# Patient Record
Sex: Female | Born: 2000 | Race: White | Hispanic: No | Marital: Single | Smoking: Never smoker
Health system: Southern US, Community
[De-identification: ages and names within clinical notes are randomized; demographics above are authoritative.]

---
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
## 2022-04-22 ENCOUNTER — Other Ambulatory Visit: Payer: Self-pay

## 2022-04-22 ENCOUNTER — Encounter (HOSPITAL_BASED_OUTPATIENT_CLINIC_OR_DEPARTMENT_OTHER): Payer: Self-pay

## 2022-04-22 DIAGNOSIS — M791 Myalgia, unspecified site: Secondary | ICD-10-CM | POA: Insufficient documentation

## 2022-04-22 DIAGNOSIS — Z5321 Procedure and treatment not carried out due to patient leaving prior to being seen by health care provider: Secondary | ICD-10-CM | POA: Diagnosis not present

## 2022-04-22 DIAGNOSIS — R079 Chest pain, unspecified: Secondary | ICD-10-CM | POA: Diagnosis present

## 2022-04-22 NOTE — ED Triage Notes (Signed)
Pt to ED by POV from home with c/o CP and body aches. Pt has been seen by [facility] ED and PCP for the same. Onset of 2 weeks ago. Arrives A+O, VSS, NADN.

## 2022-04-23 ENCOUNTER — Encounter (HOSPITAL_BASED_OUTPATIENT_CLINIC_OR_DEPARTMENT_OTHER): Payer: Self-pay | Admitting: Emergency Medicine

## 2022-04-23 ENCOUNTER — Emergency Department (HOSPITAL_BASED_OUTPATIENT_CLINIC_OR_DEPARTMENT_OTHER)
Admission: EM | Admit: 2022-04-23 | Discharge: 2022-04-23 | Discharge status: Against Medical Advice | Payer: 59 | Attending: Emergency Medicine | Admitting: Emergency Medicine

## 2022-04-23 ENCOUNTER — Emergency Department (HOSPITAL_BASED_OUTPATIENT_CLINIC_OR_DEPARTMENT_OTHER)
Admission: EM | Admit: 2022-04-23 | Discharge: 2022-04-23 | Discharge status: Against Medical Advice | Payer: 59 | Source: Home / Self Care

## 2022-04-23 DIAGNOSIS — R079 Chest pain, unspecified: Secondary | ICD-10-CM | POA: Insufficient documentation

## 2022-04-23 DIAGNOSIS — Z5321 Procedure and treatment not carried out due to patient leaving prior to being seen by health care provider: Secondary | ICD-10-CM | POA: Insufficient documentation

## 2022-04-23 NOTE — ED Notes (Signed)
Pt was seen leaving by registration 

## 2022-04-23 NOTE — ED Triage Notes (Signed)
"  Heart pains" starting 2 weeks ago, several ed visit and pcp visit not improved, "getting worse" Intermittant, "it just comes and goes"

## 2022-04-23 NOTE — ED Notes (Signed)
Patient told registration that she was leaving and was seen leaving by registration.

## 2022-11-13 DIAGNOSIS — Z111 Encounter for screening for respiratory tuberculosis: Secondary | ICD-10-CM | POA: Insufficient documentation

## 2023-09-01 ENCOUNTER — Encounter (HOSPITAL_BASED_OUTPATIENT_CLINIC_OR_DEPARTMENT_OTHER): Payer: Self-pay | Admitting: Family Medicine

## 2023-09-01 ENCOUNTER — Ambulatory Visit (INDEPENDENT_AMBULATORY_CARE_PROVIDER_SITE_OTHER): Payer: 59 | Admitting: Family Medicine

## 2023-09-01 VITALS — BP 112/78 | HR 93 | Ht 65.0 in | Wt 133.6 lb

## 2023-09-01 DIAGNOSIS — Z0000 Encounter for general adult medical examination without abnormal findings: Secondary | ICD-10-CM

## 2023-09-01 DIAGNOSIS — F419 Anxiety disorder, unspecified: Secondary | ICD-10-CM | POA: Diagnosis not present

## 2023-09-01 MED ORDER — SERTRALINE HCL 50 MG PO TABS: 50.0000 mg | ORAL_TABLET | Freq: Every day | ORAL | 1 refills | Status: DC

## 2023-09-01 NOTE — Assessment & Plan Note (Signed)
Discussed management options.  She would like to start alternative medication.  Discussed recommendation for SSRI, could consider medication exertionally.  She does report that her mother does take sertraline with good control of symptoms.  Can proceed with low-dose of this medication and monitor progress.  We will plan for follow-up in about 2 weeks to assess response and tolerability of medication.  Follow-up visit can be virtual We also discussed role of counseling/therapy.  She has not done this in the past and declines referral for this at present.

## 2023-09-01 NOTE — Progress Notes (Signed)
New Patient Office Visit  Subjective    Patient ID: Savannah Flowers, female    DOB: 2001-02-02  Age: 22 y.o. MRN: [id number]  CC:  Chief Complaint  Patient presents with   New Patient (Initial Visit)    New patient was seeing marsha white at [facility] and needed a new primary care would like to get back on anxiety medicine but does not want lexapro    HPI Savannah Flowers presents to establish care Last PCP - Kathlen Brunswick with [facility]  Anxiety: Reports being diagnosed however more than 1 year ago.  She initially started on Lexapro and generally was doing well with this with good symptom control reported.  Unfortunately, she does feel that she had some issues/side effects with the medication.  She did have some trouble with left-sided chest wall pain.  She did have evaluation at urgent care and reportedly was told that it was related to fibrocystic changes of breast.  She does note that when she stopped taking Lexapro that this chest pain resolved.  She is wanting to resume medication to help with controlling anxiety symptoms, however would prefer to not go back onto Lexapro. She denies any issues with lumps/bumps or other issues with breasts.  Denies any wounds or skin changes or other concerns.  She reports her last physical with her prior PCP was about 1 year ago.  Would like to arrange to have next physical completed  Patient is originally from [place]. Patient works at [employer]. 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 Eventual plan is for nursing school. Outside of work, she enjoys spending time with friends.  Outpatient Encounter Medications as of 09/01/2023  Medication Sig   sertraline (ZOLOFT) 50 MG tablet Take 1 tablet (50 mg total) by mouth daily.   [DISCONTINUED] doxycycline (ADOXA) 100 MG tablet Take 100 mg by mouth 2 (two) times daily.   [DISCONTINUED] ferrous sulfate 325 (65 FE) MG tablet Take 325 mg by mouth daily with breakfast.   [DISCONTINUED] predniSONE  (STERAPRED UNI-PAK 21 TAB) 5 MG (21) TBPK tablet Take 5 mg by mouth daily.   No facility-administered encounter medications on file as of 09/01/2023.    History reviewed. No pertinent past medical history.  Past Surgical History:  Procedure Laterality Date   PERCUTANEOUS PINNING  06/13/2012   Procedure: PERCUTANEOUS PINNING EXTREMITY;  Surgeon: Budd Palmer, MD;  Location: [facility] OR;  Service: Orthopedics;  Laterality: Left;  Closed Reduction and percutaneous pinning supracondylar humerus fracture/elbow    History reviewed. No pertinent family history.  Social History   Socioeconomic History   Marital status: Single    Spouse name: Not on file   Number of children: Not on file   Years of education: Not on file   Highest education level: 12th grade  Occupational History   Not on file  Tobacco Use   Smoking status: Never    Passive exposure: Never   Smokeless tobacco: Not on file  Vaping Use   Vaping status: Every Day  Substance and Sexual Activity   Alcohol use: No   Drug use: No   Sexual activity: Not on file  Other Topics Concern   Not on file  Social History Narrative   Not on file   Social Determinants of Health   Financial Resource Strain: Medium Risk (08/26/2023)   Overall Financial Resource Strain (CARDIA)    Difficulty of Paying Living Expenses: Somewhat hard  Food Insecurity: Patient Declined (08/26/2023)   Hunger Vital Sign  Worried About Programme researcher, broadcasting/film/video in the Last Year: Patient declined    Barista in the Last Year: Patient declined  Transportation Needs: No Transportation Needs (08/26/2023)   PRAPARE - Administrator, Civil Service (Medical): No    Lack of Transportation (Non-Medical): No  Physical Activity: Insufficiently Active (08/26/2023)   Exercise Vital Sign    Days of Exercise per Week: 1 day    Minutes of Exercise per Session: 60 min  Stress: Stress Concern Present (08/26/2023)   [employer] of Occupational Health -  Occupational Stress Questionnaire    Feeling of Stress : Very much  Social Connections: Moderately Isolated (08/26/2023)   Social Connection and Isolation Panel [NHANES]    Frequency of Communication with Friends and Family: More than three times a week    Frequency of Social Gatherings with Friends and Family: Three times a week    Attends Religious Services: 1 to 4 times per year    Active Member of Clubs or Organizations: No    Attends Banker Meetings: Not on file    Marital Status: Never married  Intimate Partner Violence: Unknown (01/21/2022)   Received from [employer], [facility]   HITS    Physically Hurt: Not on file    Insult or Talk Down To: Not on file    Threaten Physical Harm: Not on file    Scream or Curse: Not on file    Objective    BP 112/78 (BP Location: Right Arm, Patient Position: Sitting, Cuff Size: Normal)   Pulse 93   Ht 5\' 5"  (1.651 m)   Wt 133 lb 9.6 oz (60.6 kg)   SpO2 100%   BMI 22.23 kg/m   Physical Exam  22 year old female in no acute distress Cardiovascular exam with regular rate and rhythm, no murmur appreciated Lungs clear to auscultation bilaterally  Assessment & Plan:   Anxiety Assessment & Plan: Discussed management options.  She would like to start alternative medication.  Discussed recommendation for SSRI, could consider medication exertionally.  She does report that her mother does take sertraline with good control of symptoms.  Can proceed with low-dose of this medication and monitor progress.  We will plan for follow-up in about 2 weeks to assess response and tolerability of medication.  Follow-up visit can be virtual We also discussed role of counseling/therapy.  She has not done this in the past and declines referral for this at present.   Wellness examination -     CBC with Differential/Platelet; Future -     Comprehensive metabolic panel; Future -     Lipid panel; Future -     TSH Rfx on Abnormal to Free T4;  Future  Other orders -     Sertraline HCl; Take 1 tablet (50 mg total) by mouth daily.  Dispense: 30 tablet; Refill: 1  Return in about 6 weeks (around 10/13/2023) for CPE with fasting labs 1 week prior.    ___________________________________________ Snigdha Howser de [place], MD, ABFM, CAQSM Primary Care and Sports Medicine [facility]

## 2023-09-15 ENCOUNTER — Telehealth (HOSPITAL_BASED_OUTPATIENT_CLINIC_OR_DEPARTMENT_OTHER): Payer: Self-pay | Admitting: *Deleted

## 2023-09-15 ENCOUNTER — Encounter (HOSPITAL_BASED_OUTPATIENT_CLINIC_OR_DEPARTMENT_OTHER): Payer: Self-pay | Admitting: Family Medicine

## 2023-09-15 ENCOUNTER — Telehealth (HOSPITAL_BASED_OUTPATIENT_CLINIC_OR_DEPARTMENT_OTHER): Payer: 59 | Admitting: Family Medicine

## 2023-09-15 DIAGNOSIS — F419 Anxiety disorder, unspecified: Secondary | ICD-10-CM

## 2023-09-15 NOTE — Telephone Encounter (Signed)
Copied from CRM [id number]. Topic: Appointments - Appointment Info/Confirmation >> Sep 12, 2023  9:02 AM Conni Elliot wrote: Patient/patient representative is calling for information regarding an appointment. Pt missed confirmation call and called back. Pt also has questions in regards to 12/31 appt

## 2023-09-15 NOTE — Progress Notes (Signed)
Appointment canceled and rescheduled.

## 2023-09-16 ENCOUNTER — Telehealth (HOSPITAL_BASED_OUTPATIENT_CLINIC_OR_DEPARTMENT_OTHER): Payer: 59 | Admitting: Family Medicine

## 2023-09-16 ENCOUNTER — Encounter (HOSPITAL_BASED_OUTPATIENT_CLINIC_OR_DEPARTMENT_OTHER): Payer: Self-pay | Admitting: Family Medicine

## 2023-09-16 DIAGNOSIS — F419 Anxiety disorder, unspecified: Secondary | ICD-10-CM

## 2023-09-16 NOTE — Progress Notes (Signed)
   Virtual Visit  I connected with  Savannah Flowers  on 09/16/23 by telehealth and verified that I am speaking with the correct person using two identifiers. Visit completed via video.  I discussed the limitations, risks, security and privacy concerns of performing an evaluation and management service by telephone, including the higher likelihood of inaccurate diagnosis and treatment, and the availability of in person appointments.  We also discussed the likely need of an additional face to face encounter for complete and high quality delivery of care.  I also discussed with the patient that there may be a patient responsible charge related to this service. The patient expressed understanding and wishes to proceed.  Provider location is in medical facility. Patient location is at their home, different from provider location. People involved in care of the patient during this telehealth encounter were myself, my nurse/medical assistant, and my front office/scheduling team member.  Review of Systems: No fevers, chills, night sweats, weight loss, chest pain, or shortness of breath.   Objective Findings:    General: Speaking full sentences, no audible heavy breathing.  Sounds alert and appropriately interactive.    Independent interpretation of tests performed by another provider:   None.  Brief History, Exam, Impression, and Recommendations:    Anxiety Patient has been taking medication as prescribed.  She does note that she has had some nausea with medication, denies any vomiting.  She does feel that she has had some improvement in symptoms with use of medication.  Denies any other side effects. We discussed options today related to medication.  Considerations include stopping medication and switching to alternative, decreasing dose of medication and monitoring response, continuing with current dose and assessing progress moving forward.  After discussion of options, patient elected to  proceed with same dose of medication at this time and monitoring side effects as well as symptom improvement.  We will plan to follow-up at previously scheduled appointment in about 1 month.  Did discuss with patient that we can schedule sooner appointment if needed based on progress with medication or any issues arise.  I discussed the above assessment and treatment plan with the patient. The patient was provided an opportunity to ask questions and all were answered. The patient agreed with the plan and demonstrated an understanding of the instructions.   The patient was advised to call back or seek an in-person evaluation if the symptoms worsen or if the condition fails to improve as anticipated.   I provided 11 minutes of face to face and non-face-to-face time during this encounter date, time was needed to gather information, review chart, records, communicate/coordinate with staff remotely, as well as complete documentation.   ___________________________________________ Izear Pine de [place], MD, ABFM, CAQSM Primary Care and Sports Medicine [facility]

## 2023-09-16 NOTE — Assessment & Plan Note (Signed)
Patient has been taking medication as prescribed.  She does note that she has had some nausea with medication, denies any vomiting.  She does feel that she has had some improvement in symptoms with use of medication.  Denies any other side effects. We discussed options today related to medication.  Considerations include stopping medication and switching to alternative, decreasing dose of medication and monitoring response, continuing with current dose and assessing progress moving forward.  After discussion of options, patient elected to proceed with same dose of medication at this time and monitoring side effects as well as symptom improvement.  We will plan to follow-up at previously scheduled appointment in about 1 month.  Did discuss with patient that we can schedule sooner appointment if needed based on progress with medication or any issues arise.

## 2023-09-26 ENCOUNTER — Other Ambulatory Visit (HOSPITAL_BASED_OUTPATIENT_CLINIC_OR_DEPARTMENT_OTHER): Payer: Self-pay | Admitting: Family Medicine

## 2023-10-02 ENCOUNTER — Encounter (HOSPITAL_BASED_OUTPATIENT_CLINIC_OR_DEPARTMENT_OTHER): Payer: 59 | Admitting: Family Medicine

## 2023-10-21 ENCOUNTER — Encounter (HOSPITAL_BASED_OUTPATIENT_CLINIC_OR_DEPARTMENT_OTHER): Payer: 59 | Admitting: Family Medicine

## 2023-10-30 ENCOUNTER — Encounter (HOSPITAL_BASED_OUTPATIENT_CLINIC_OR_DEPARTMENT_OTHER): Payer: Self-pay | Admitting: Family Medicine

## 2023-10-30 ENCOUNTER — Ambulatory Visit (INDEPENDENT_AMBULATORY_CARE_PROVIDER_SITE_OTHER): Payer: 59 | Admitting: Family Medicine

## 2023-10-30 VITALS — BP 118/78 | HR 76 | Ht 65.0 in | Wt 128.4 lb

## 2023-10-30 DIAGNOSIS — Z118 Encounter for screening for other infectious and parasitic diseases: Secondary | ICD-10-CM

## 2023-10-30 DIAGNOSIS — Z0000 Encounter for general adult medical examination without abnormal findings: Secondary | ICD-10-CM

## 2023-10-30 NOTE — Assessment & Plan Note (Signed)
 Routine HCM labs ordered. HCM reviewed/discussed. Anticipatory guidance regarding healthy weight, lifestyle and choices given. Recommend healthy diet.  Recommend approximately 150 minutes/week of moderate intensity exercise Recommend regular dental and vision exams Always use seatbelt/lap and shoulder restraints Recommend using smoke alarms and checking batteries at least twice a year Recommend using sunscreen when outside Discussed tetanus immunization recommendations, patient is UTD

## 2023-10-30 NOTE — Progress Notes (Signed)
 Subjective:    CC: Annual Physical Exam  HPI:  Savannah Flowers is a 23 y.o. presenting for annual physical  I reviewed the past medical history, family history, social history, surgical history, and allergies today and no changes were needed.  Please see the problem list section below in epic for further details.  Past Medical History: History reviewed. No pertinent past medical history. Past Surgical History: Past Surgical History:  Procedure Laterality Date   PERCUTANEOUS PINNING  06/13/2012   Procedure: PERCUTANEOUS PINNING EXTREMITY;  Surgeon: Ozell VEAR Bruch, MD;  Location: [facility] OR;  Service: Orthopedics;  Laterality: Left;  Closed Reduction and percutaneous pinning supracondylar humerus fracture/elbow   Social History: Social History   Socioeconomic History   Marital status: Single    Spouse name: Not on file   Number of children: Not on file   Years of education: Not on file   Highest education level: 12th grade  Occupational History   Not on file  Tobacco Use   Smoking status: Never    Passive exposure: Never   Smokeless tobacco: Never  Vaping Use   Vaping status: Every Day  Substance and Sexual Activity   Alcohol use: No   Drug use: No   Sexual activity: Not on file  Other Topics Concern   Not on file  Social History Narrative   Not on file   Social Drivers of Health   Financial Resource Strain: Medium Risk (08/26/2023)   Overall Financial Resource Strain (CARDIA)    Difficulty of Paying Living Expenses: Somewhat hard  Food Insecurity: Patient Declined (08/26/2023)   Hunger Vital Sign    Worried About Running Out of Food in the Last Year: Patient declined    Ran Out of Food in the Last Year: Patient declined  Transportation Needs: No Transportation Needs (08/26/2023)   PRAPARE - Administrator, Civil Service (Medical): No    Lack of Transportation (Non-Medical): No  Physical Activity: Insufficiently Active (08/26/2023)   Exercise Vital Sign     Days of Exercise per Week: 1 day    Minutes of Exercise per Session: 60 min  Stress: Stress Concern Present (08/26/2023)   [employer] of Occupational Health - Occupational Stress Questionnaire    Feeling of Stress : Very much  Social Connections: Moderately Isolated (08/26/2023)   Social Connection and Isolation Panel [NHANES]    Frequency of Communication with Friends and Family: More than three times a week    Frequency of Social Gatherings with Friends and Family: Three times a week    Attends Religious Services: 1 to 4 times per year    Active Member of Clubs or Organizations: No    Attends Engineer, Structural: Not on file    Marital Status: Never married   Family History: History reviewed. No pertinent family history. Allergies: Allergies  Allergen Reactions   Aspirin-Acetaminophen -Caffeine Swelling    Lips swollen   Excedrin Tension Headache [Acetaminophen -Caffeine] Swelling   Neomycin-Bacitracin Zn-Polymyx Other (See Comments)    pain   Medications: See med rec.  Review of Systems: No headache, visual changes, nausea, vomiting, diarrhea, constipation, dizziness, abdominal pain, skin rash, fevers, chills, night sweats, swollen lymph nodes, weight loss, chest pain, body aches, joint swelling, muscle aches, shortness of breath, mood changes, visual or auditory hallucinations.  Objective:    BP 118/78 (BP Location: Right Arm, Patient Position: Sitting, Cuff Size: Normal)   Pulse 76   Ht 5' 5 (1.651 m)   Wt 128 lb  6.4 oz (58.2 kg)   SpO2 97%   BMI 21.37 kg/m   General: Well Developed, well nourished, and in no acute distress.  Neuro: Alert and oriented x3, extra-ocular muscles intact, sensation grossly intact. Cranial nerves II through XII are intact, motor, sensory, and coordinative functions are all intact. HEENT: Normocephalic, atraumatic, pupils equal round reactive to light, neck supple, no masses, no lymphadenopathy, thyroid nonpalpable. Oropharynx,  nasopharynx, external ear canals are unremarkable. Skin: Warm and dry, no rashes noted.  Cardiac: Regular rate and rhythm, no murmurs rubs or gallops.  Respiratory: Clear to auscultation bilaterally. Not using accessory muscles, speaking in full sentences.  Abdominal: Soft, nontender, nondistended, positive bowel sounds, no masses, no organomegaly.  Musculoskeletal: Shoulder, elbow, wrist, hip, knee, ankle stable, and with full range of motion.  Impression and Recommendations:    Encounter for screening examination for chlamydial infection -     Chlamydia/GC NAA, Confirmation  Wellness examination Assessment & Plan: Routine HCM labs ordered. HCM reviewed/discussed. Anticipatory guidance regarding healthy weight, lifestyle and choices given. Recommend healthy diet.  Recommend approximately 150 minutes/week of moderate intensity exercise Recommend regular dental and vision exams Always use seatbelt/lap and shoulder restraints Recommend using smoke alarms and checking batteries at least twice a year Recommend using sunscreen when outside Discussed tetanus immunization recommendations, patient is UTD   Return in about 1 year (around 10/29/2024) for CPE.   ___________________________________________ Jary Louvier de [place], MD, ABFM, CAQSM Primary Care and Sports Medicine [facility]

## 2023-10-30 NOTE — Patient Instructions (Signed)
   Medication Instructions:  Your physician recommends that you continue on your current medications as directed. Please refer to the Current Medication list given to you today. --If you need a refill on any your medications before your next appointment, please call your pharmacy first. If no refills are authorized on file call the office.--   Follow-Up: Your next appointment:   Your physician recommends that you schedule a follow-up appointment in: 1 year physical with Dr. de [place]  You will receive a text message or e-mail with a link to a survey about your care and experience with [place] today! We would greatly appreciate your feedback!   Thanks for letting us be apart of your health journey!!  Primary Care and Sports Medicine   Dr. Ceasar Mons [place]   We encourage you to activate your patient portal called "MyChart".  Sign up information is provided on this After Visit Summary.  MyChart is used to connect with patients for Virtual Visits (Telemedicine).  Patients are able to view lab/test results, encounter notes, upcoming appointments, etc.  Non-urgent messages can be sent to your provider as well. To learn more about what you can do with MyChart, please visit --  [email or web address].

## 2023-10-31 LAB — CBC WITH DIFFERENTIAL/PLATELET
Basophils Absolute: 0.1 10*3/uL (ref 0.0–0.2)
Basos: 1 %
EOS (ABSOLUTE): 0.3 10*3/uL (ref 0.0–0.4)
Eos: 4 %
Hematocrit: 45.3 % (ref 34.0–46.6)
Hemoglobin: 14.6 g/dL (ref 11.1–15.9)
Immature Grans (Abs): 0 10*3/uL (ref 0.0–0.1)
Immature Granulocytes: 0 %
Lymphocytes Absolute: 2.3 10*3/uL (ref 0.7–3.1)
Lymphs: 33 %
MCH: 32 pg (ref 26.6–33.0)
MCHC: 32.2 g/dL (ref 31.5–35.7)
MCV: 99 fL — ABNORMAL HIGH (ref 79–97)
Monocytes Absolute: 0.5 10*3/uL (ref 0.1–0.9)
Monocytes: 7 %
Neutrophils Absolute: 3.7 10*3/uL (ref 1.4–7.0)
Neutrophils: 55 %
Platelets: 261 10*3/uL (ref 150–450)
RBC: 4.56 x10E6/uL (ref 3.77–5.28)
RDW: 12.2 % (ref 11.7–15.4)
WBC: 6.8 10*3/uL (ref 3.4–10.8)

## 2023-10-31 LAB — COMPREHENSIVE METABOLIC PANEL
ALT: 10 [IU]/L (ref 0–32)
AST: 14 [IU]/L (ref 0–40)
Albumin: 4.9 g/dL (ref 4.0–5.0)
Alkaline Phosphatase: 69 [IU]/L (ref 42–106)
BUN/Creatinine Ratio: 9 (ref 9–23)
BUN: 6 mg/dL (ref 6–20)
Bilirubin Total: 0.6 mg/dL (ref 0.0–1.2)
CO2: 23 mmol/L (ref 20–29)
Calcium: 9.8 mg/dL (ref 8.7–10.2)
Chloride: 102 mmol/L (ref 96–106)
Creatinine, Ser: 0.7 mg/dL (ref 0.57–1.00)
Globulin, Total: 2.8 g/dL (ref 1.5–4.5)
Glucose: 91 mg/dL (ref 70–99)
Potassium: 3.9 mmol/L (ref 3.5–5.2)
Sodium: 140 mmol/L (ref 134–144)
Total Protein: 7.7 g/dL (ref 6.0–8.5)
eGFR: 127 mL/min/{1.73_m2} (ref >59)

## 2023-10-31 LAB — LIPID PANEL
Chol/HDL Ratio: 2.3 {ratio} (ref 0.0–4.4)
Cholesterol, Total: 144 mg/dL (ref 100–199)
HDL: 62 mg/dL (ref >39)
LDL Chol Calc (NIH): 70 mg/dL (ref 0–99)
Triglycerides: 57 mg/dL (ref 0–149)
VLDL Cholesterol Cal: 12 mg/dL (ref 5–40)

## 2023-10-31 LAB — TSH RFX ON ABNORMAL TO FREE T4: TSH: 1.7 u[IU]/mL (ref 0.450–4.500)

## 2023-11-02 LAB — CHLAMYDIA/GC NAA, CONFIRMATION
Chlamydia trachomatis, NAA: NEGATIVE
Neisseria gonorrhoeae, NAA: NEGATIVE

## 2023-11-05 ENCOUNTER — Encounter (HOSPITAL_BASED_OUTPATIENT_CLINIC_OR_DEPARTMENT_OTHER): Payer: Self-pay | Admitting: Family Medicine

## 2023-11-05 DIAGNOSIS — R519 Headache, unspecified: Secondary | ICD-10-CM

## 2023-11-18 ENCOUNTER — Encounter (HOSPITAL_BASED_OUTPATIENT_CLINIC_OR_DEPARTMENT_OTHER): Payer: Self-pay | Admitting: Family Medicine

## 2023-12-01 ENCOUNTER — Ambulatory Visit (INDEPENDENT_AMBULATORY_CARE_PROVIDER_SITE_OTHER): Payer: 59 | Admitting: Family Medicine

## 2023-12-01 VITALS — BP 106/73 | HR 94 | Ht 65.0 in | Wt 128.0 lb

## 2023-12-01 DIAGNOSIS — G8929 Other chronic pain: Secondary | ICD-10-CM | POA: Diagnosis not present

## 2023-12-01 DIAGNOSIS — R519 Headache, unspecified: Secondary | ICD-10-CM | POA: Insufficient documentation

## 2023-12-01 NOTE — Patient Instructions (Signed)
  Medication Instructions:  Your physician recommends that you continue on your current medications as directed. Please refer to the Current Medication list given to you today. --If you need a refill on any your medications before your next appointment, please call your pharmacy first. If no refills are authorized on file call the office.--   Referrals/Procedures/Imaging: Referral placed  Follow-Up: Your next appointment:   Your physician recommends that you schedule a follow-up appointment in: as needed with Dr. de Peru  You will receive a text message or e-mail with a link to a survey about your care and experience with us  today! We would greatly appreciate your feedback!   Thanks for letting us  be apart of your health journey!!  Primary Care and Sports Medicine   Dr. Court Distance Peru   We encourage you to activate your patient portal called "MyChart".  Sign up information is provided on this After Visit Summary.  MyChart is used to connect with patients for Virtual Visits (Telemedicine).  Patients are able to view lab/test results, encounter notes, upcoming appointments, etc.  Non-urgent messages can be sent to your provider as well. To learn more about what you can do with MyChart, please visit --  [email or web address].

## 2023-12-01 NOTE — Progress Notes (Signed)
    Procedures performed today:    None.  Independent interpretation of notes and tests performed by another provider:   None.  Brief History, Exam, Impression, and Recommendations:    BP 106/73 (BP Location: Left Arm, Patient Position: Sitting, Cuff Size: Normal)   Pulse 94   Ht 5\' 5"  (1.651 m)   Wt 128 lb (58.1 kg)   LMP 11/17/2023 (Approximate)   SpO2 100%   BMI 21.30 kg/m   Chronic nonintractable headache, unspecified headache type Assessment & Plan: Headaches have been an issue for a number of years, has met with Neurologist in the past through [place], denies specific treatment. Has not noticed a change in frequency or severity. Headaches can last for days. Will be worsened by lights, noises, screens. Will take Advil for headaches, minimal relief. Not aware of any triggers for headaches. Not aware of family history of headaches or migraines.  Chart review does indicate treatments prescribed in the past have included amitriptyline, baclofen, Maxalt, magnesium, B12. Given chronicity of symptoms, can proceed with referral to neurology for further evaluation and treatment recommendations. Referral placed today.   Return if symptoms worsen or fail to improve.   ___________________________________________ Savannah Schriner de Peru, MD, ABFM, [facility] Primary Care and Sports Medicine [facility]

## 2023-12-01 NOTE — Assessment & Plan Note (Signed)
 Headaches have been an issue for a number of years, has met with Neurologist in the past through [place], denies specific treatment. Has not noticed a change in frequency or severity. Headaches can last for days. Will be worsened by lights, noises, screens. Will take Advil for headaches, minimal relief. Not aware of any triggers for headaches. Not aware of family history of headaches or migraines.  Chart review does indicate treatments prescribed in the past have included amitriptyline, baclofen, Maxalt, magnesium, B12. Given chronicity of symptoms, can proceed with referral to neurology for further evaluation and treatment recommendations. Referral placed today.

## 2024-03-01 ENCOUNTER — Encounter (HOSPITAL_BASED_OUTPATIENT_CLINIC_OR_DEPARTMENT_OTHER): Payer: Self-pay | Admitting: Family Medicine

## 2024-03-01 NOTE — Telephone Encounter (Signed)
 Pt scheduled mychart virtual appt for 5/13

## 2024-03-02 ENCOUNTER — Encounter (HOSPITAL_BASED_OUTPATIENT_CLINIC_OR_DEPARTMENT_OTHER): Payer: Self-pay | Admitting: Family Medicine

## 2024-03-02 ENCOUNTER — Telehealth (HOSPITAL_BASED_OUTPATIENT_CLINIC_OR_DEPARTMENT_OTHER): Admitting: Family Medicine

## 2024-03-02 DIAGNOSIS — F419 Anxiety disorder, unspecified: Secondary | ICD-10-CM

## 2024-03-02 MED ORDER — SERTRALINE HCL 50 MG PO TABS: 100.0000 mg | ORAL_TABLET | Freq: Every day | ORAL | 1 refills | Status: DC

## 2024-03-02 NOTE — Patient Instructions (Signed)
  Medication Instructions:  Your physician recommends that you continue on your current medications as directed. Please refer to the Current Medication list given to you today. --If you need a refill on any your medications before your next appointment, please call your pharmacy first. If no refills are authorized on file call the office.--   Follow-Up: Your next appointment:   Your physician recommends that you schedule a follow-up appointment in: 3-4 weeks (virtual) with Dr. de Peru  You will receive a text message or e-mail with a link to a survey about your care and experience with us  today! We would greatly appreciate your feedback!   Thanks for letting us  be apart of your health journey!!  Primary Care and Sports Medicine   Dr. Court Distance Peru   We encourage you to activate your patient portal called "MyChart".  Sign up information is provided on this After Visit Summary.  MyChart is used to connect with patients for Virtual Visits (Telemedicine).  Patients are able to view lab/test results, encounter notes, upcoming appointments, etc.  Non-urgent messages can be sent to your provider as well. To learn more about what you can do with MyChart, please visit --  [email or web address].

## 2024-03-02 NOTE — Assessment & Plan Note (Signed)
 Patient reports that a couple months ago, she did attempt to come off of medication, however had symptoms with doing so.  She did resume medication and has not noticed any significant side effects, however she does feel that underlying symptoms of anxiety are not as controlled as they were previously.  She wonders about possibly adjusting dose of medication or making other adjustments to better control symptoms. We did review considerations including adjusting dose of Zoloft  or potentially utilizing augmentation agent such as buspirone.  After discussion, patient elected to proceed with dose increase of Zoloft .  We will increase to 100 mg daily.  Cautioned on potential side effects.  We will monitor progress with this new dose.  If noticing side effects, can reduce to 75 mg dose.  Ultimately, if not tolerating higher dose of medication but with residual symptoms, we can consider utilizing augmentation agent such as buspirone.  We will plan to follow-up in about 3 to 4 weeks to assess progress

## 2024-03-02 NOTE — Progress Notes (Signed)
   Virtual Visit   I connected with  Savannah Flowers  on 03/02/24 by telehealth and verified that I am speaking with the correct person using two identifiers. Visit completed via video.   I discussed the limitations, risks, security and privacy concerns of performing an evaluation and management service by telephone, including the higher likelihood of inaccurate diagnosis and treatment, and the availability of in person appointments.  We also discussed the likely need of an additional face to face encounter for complete and high quality delivery of care.  I also discussed with the patient that there may be a patient responsible charge related to this service. The patient expressed understanding and wishes to proceed.  Provider location is in medical facility. Patient location is at their home, different from provider location. People involved in care of the patient during this telehealth encounter were myself, my nurse/medical assistant, and my front office/scheduling team member.  Review of Systems: No fevers, chills, night sweats, weight loss, chest pain, or shortness of breath.   Objective Findings:    General: Speaking full sentences, no audible heavy breathing.  Sounds alert and appropriately interactive.    Independent interpretation of tests performed by another provider:   None.  Brief History, Exam, Impression, and Recommendations:    Anxiety Patient reports that a couple months ago, she did attempt to come off of medication, however had symptoms with doing so.  She did resume medication and has not noticed any significant side effects, however she does feel that underlying symptoms of anxiety are not as controlled as they were previously.  She wonders about possibly adjusting dose of medication or making other adjustments to better control symptoms. We did review considerations including adjusting dose of Zoloft  or potentially utilizing augmentation agent such as buspirone.   After discussion, patient elected to proceed with dose increase of Zoloft .  We will increase to 100 mg daily.  Cautioned on potential side effects.  We will monitor progress with this new dose.  If noticing side effects, can reduce to 75 mg dose.  Ultimately, if not tolerating higher dose of medication but with residual symptoms, we can consider utilizing augmentation agent such as buspirone.  We will plan to follow-up in about 3 to 4 weeks to assess progress   I discussed the above assessment and treatment plan with the patient. The patient was provided an opportunity to ask questions and all were answered. The patient agreed with the plan and demonstrated an understanding of the instructions.   The patient was advised to call back or seek an in-person evaluation if the symptoms worsen or if the condition fails to improve as anticipated.   I provided 12 minutes of face to face and non-face-to-face time during this encounter date, time was needed to gather information, review chart, records, communicate/coordinate with staff remotely, as well as complete documentation.   ___________________________________________ Raffi Milstein de Peru, MD, ABFM, CAQSM Primary Care and Sports Medicine [facility]

## 2024-03-05 ENCOUNTER — Telehealth: Payer: Self-pay | Admitting: Neurology

## 2024-03-05 NOTE — Telephone Encounter (Signed)
 Pt called in regards to cancel appt . Pt want to try something else

## 2024-03-19 ENCOUNTER — Ambulatory Visit: Payer: 59 | Admitting: Neurology

## 2024-05-10 ENCOUNTER — Encounter (HOSPITAL_BASED_OUTPATIENT_CLINIC_OR_DEPARTMENT_OTHER): Payer: Self-pay | Admitting: Family Medicine

## 2024-05-10 NOTE — Telephone Encounter (Signed)
 Dr. De Peru pt. Savannah Flowers, please see mychart sent by pt and advise if you have any recommendations.

## 2024-05-11 ENCOUNTER — Other Ambulatory Visit (HOSPITAL_BASED_OUTPATIENT_CLINIC_OR_DEPARTMENT_OTHER): Payer: Self-pay | Admitting: Family Medicine

## 2024-05-11 ENCOUNTER — Encounter (HOSPITAL_BASED_OUTPATIENT_CLINIC_OR_DEPARTMENT_OTHER): Payer: Self-pay | Admitting: Family Medicine

## 2024-05-11 MED ORDER — BUSPIRONE HCL 5 MG PO TABS: 5.0000 mg | ORAL_TABLET | Freq: Two times a day (BID) | ORAL | 3 refills | Status: DC

## 2024-06-03 ENCOUNTER — Other Ambulatory Visit (HOSPITAL_BASED_OUTPATIENT_CLINIC_OR_DEPARTMENT_OTHER): Payer: Self-pay | Admitting: Family Medicine

## 2024-06-08 ENCOUNTER — Telehealth (HOSPITAL_BASED_OUTPATIENT_CLINIC_OR_DEPARTMENT_OTHER): Admitting: Family Medicine

## 2024-09-12 ENCOUNTER — Other Ambulatory Visit (HOSPITAL_BASED_OUTPATIENT_CLINIC_OR_DEPARTMENT_OTHER): Payer: Self-pay | Admitting: Family Medicine

## 2024-10-29 ENCOUNTER — Encounter (HOSPITAL_BASED_OUTPATIENT_CLINIC_OR_DEPARTMENT_OTHER): Payer: 59 | Admitting: Family Medicine

## 2024-11-17 ENCOUNTER — Encounter (HOSPITAL_BASED_OUTPATIENT_CLINIC_OR_DEPARTMENT_OTHER): Payer: Self-pay | Admitting: Family Medicine

## 2024-11-17 NOTE — Telephone Encounter (Signed)
 Copied from CRM [id number]. Topic: General - Other >> Nov 17, 2024  1:32 PM Tysheama G wrote: Reason for CRM: Patient is calling regarding a FLMRT that needs to be signed by provider and she needs clearance and stated they sent the fax over last Thursday. Verify fax number and she stated they will send again. Advised to patient a 7-10turnaround

## 2024-11-17 NOTE — Telephone Encounter (Signed)
 Please see mychart message sent by pt and advise.
